# Patient Record
Sex: Female | Born: 1965 | Race: White | Hispanic: Yes | Marital: Married
Health system: Southern US, Community
[De-identification: ages and names within clinical notes are randomized; demographics above are authoritative.]

---

## 2003-05-25 ENCOUNTER — Encounter: Admission: RE | Admit: 2003-05-25 | Discharge: 2003-05-25 | Payer: Self-pay | Admitting: *Deleted

## 2003-06-01 ENCOUNTER — Encounter: Admission: RE | Admit: 2003-06-01 | Discharge: 2003-06-01 | Payer: Self-pay | Admitting: *Deleted

## 2003-06-03 ENCOUNTER — Ambulatory Visit (HOSPITAL_COMMUNITY): Admission: RE | Admit: 2003-06-03 | Discharge: 2003-06-03 | Payer: Self-pay | Admitting: Obstetrics and Gynecology

## 2003-06-08 ENCOUNTER — Encounter: Admission: RE | Admit: 2003-06-08 | Discharge: 2003-06-08 | Payer: Self-pay | Admitting: *Deleted

## 2003-06-29 ENCOUNTER — Encounter: Admission: RE | Admit: 2003-06-29 | Discharge: 2003-06-29 | Payer: Self-pay | Admitting: Obstetrics and Gynecology

## 2003-07-06 ENCOUNTER — Encounter: Admission: RE | Admit: 2003-07-06 | Discharge: 2003-07-06 | Payer: Self-pay | Admitting: *Deleted

## 2003-07-20 ENCOUNTER — Encounter: Admission: RE | Admit: 2003-07-20 | Discharge: 2003-07-20 | Payer: Self-pay | Admitting: *Deleted

## 2003-07-27 ENCOUNTER — Ambulatory Visit (HOSPITAL_COMMUNITY): Admission: RE | Admit: 2003-07-27 | Discharge: 2003-07-27 | Payer: Self-pay | Admitting: Obstetrics and Gynecology

## 2003-08-03 ENCOUNTER — Encounter: Admission: RE | Admit: 2003-08-03 | Discharge: 2003-08-03 | Payer: Self-pay | Admitting: *Deleted

## 2003-08-07 ENCOUNTER — Inpatient Hospital Stay (HOSPITAL_COMMUNITY): Admission: AD | Admit: 2003-08-07 | Discharge: 2003-08-07 | Payer: Self-pay | Admitting: Family Medicine

## 2003-08-11 ENCOUNTER — Encounter: Admission: RE | Admit: 2003-08-11 | Discharge: 2003-08-11 | Payer: Self-pay | Admitting: Family Medicine

## 2003-08-18 ENCOUNTER — Encounter: Admission: RE | Admit: 2003-08-18 | Discharge: 2003-08-18 | Payer: Self-pay | Admitting: Family Medicine

## 2003-08-25 ENCOUNTER — Encounter: Admission: RE | Admit: 2003-08-25 | Discharge: 2003-08-25 | Payer: Self-pay | Admitting: Family Medicine

## 2003-09-08 ENCOUNTER — Encounter: Admission: RE | Admit: 2003-09-08 | Discharge: 2003-09-08 | Payer: Self-pay | Admitting: Family Medicine

## 2003-09-09 ENCOUNTER — Ambulatory Visit (HOSPITAL_COMMUNITY): Admission: RE | Admit: 2003-09-09 | Discharge: 2003-09-09 | Payer: Self-pay | Admitting: Family Medicine

## 2003-09-15 ENCOUNTER — Encounter: Admission: RE | Admit: 2003-09-15 | Discharge: 2003-09-15 | Payer: Self-pay | Admitting: Family Medicine

## 2003-09-19 ENCOUNTER — Encounter: Admission: RE | Admit: 2003-09-19 | Discharge: 2003-09-19 | Payer: Self-pay | Admitting: *Deleted

## 2003-09-22 ENCOUNTER — Ambulatory Visit: Payer: Self-pay | Admitting: Family Medicine

## 2003-09-27 ENCOUNTER — Ambulatory Visit: Payer: Self-pay | Admitting: *Deleted

## 2003-09-29 ENCOUNTER — Ambulatory Visit: Payer: Self-pay | Admitting: Family Medicine

## 2003-09-30 ENCOUNTER — Ambulatory Visit: Payer: Self-pay | Admitting: Obstetrics and Gynecology

## 2003-10-03 ENCOUNTER — Ambulatory Visit: Payer: Self-pay | Admitting: Family Medicine

## 2003-10-06 ENCOUNTER — Ambulatory Visit: Payer: Self-pay | Admitting: Family Medicine

## 2003-10-09 ENCOUNTER — Inpatient Hospital Stay (HOSPITAL_COMMUNITY): Admission: AD | Admit: 2003-10-09 | Discharge: 2003-10-11 | Payer: Self-pay | Admitting: Obstetrics and Gynecology

## 2003-10-09 ENCOUNTER — Ambulatory Visit: Payer: Self-pay | Admitting: Family Medicine

## 2006-03-18 IMAGING — US US OB DETAIL+14 WK
1 series · 13 of 28 positions shown · non-contrast
Comparison: none

CLINICAL DATA: 37-year-old, G3 P2 with LMP of 01/11/03.  Patient has history of first delivery with Down syndrome, cardiac abnormality.  History of preterm delivery with a second pregnancy.

[Series 1: unknown · 0.30mm/px · 13 of 106 slices shown]
[im 4/106]
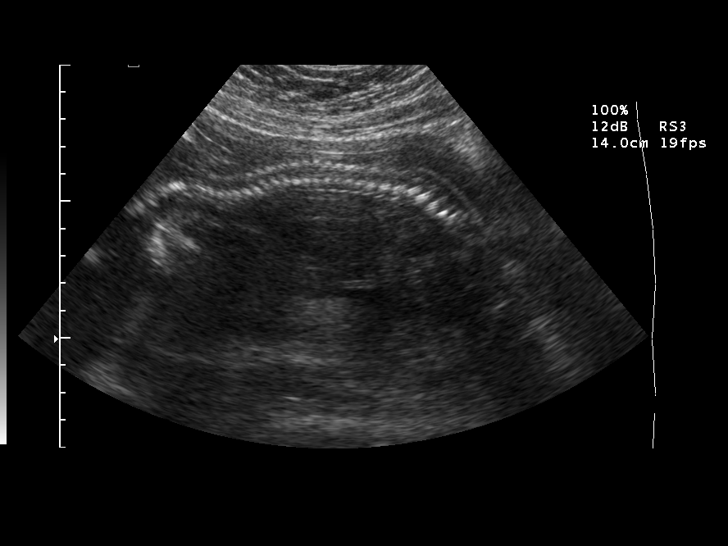
[im 12/106]
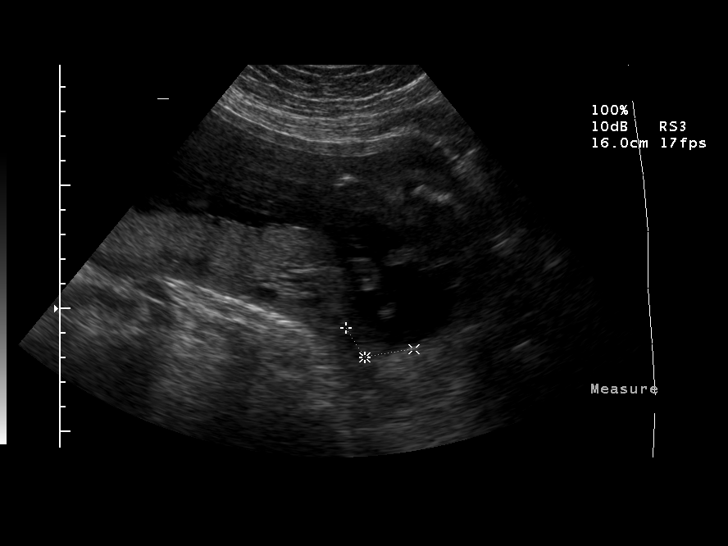
[im 20/106]
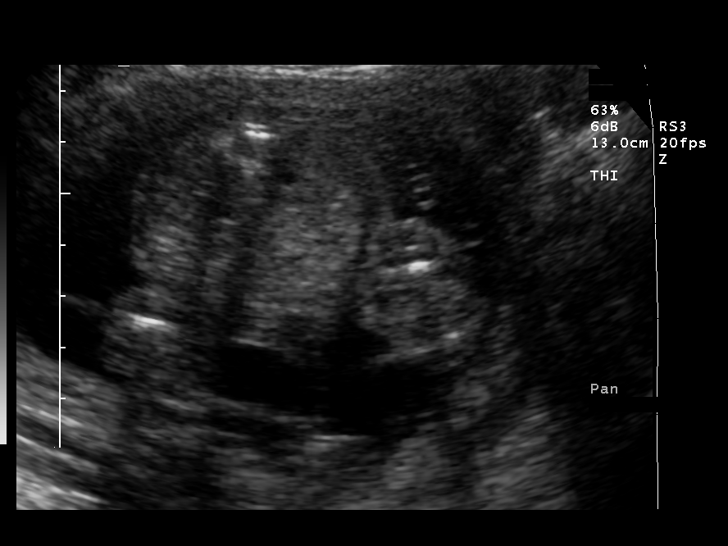
[im 28/106]
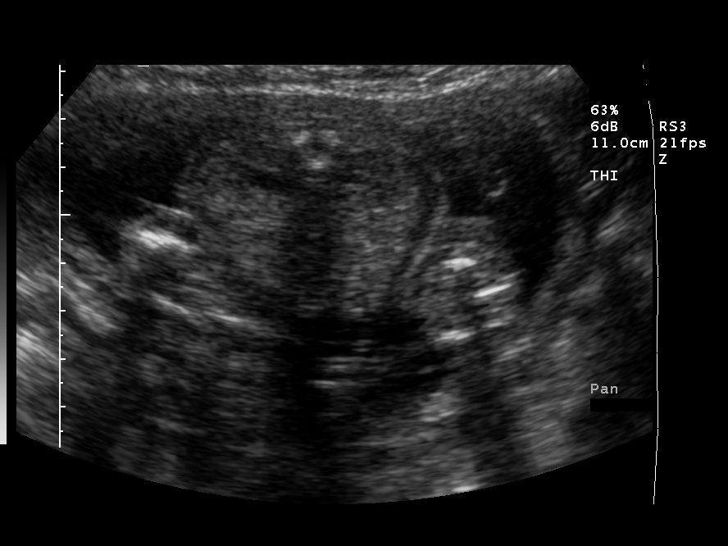
[im 36/106]
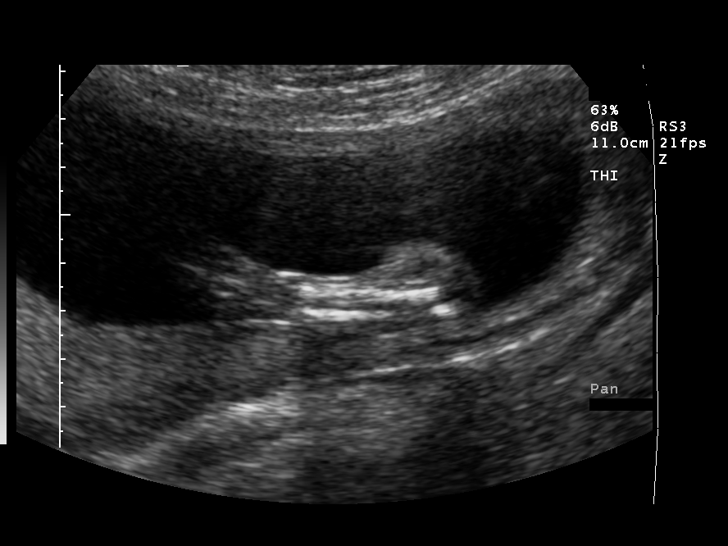
[im 43/106]
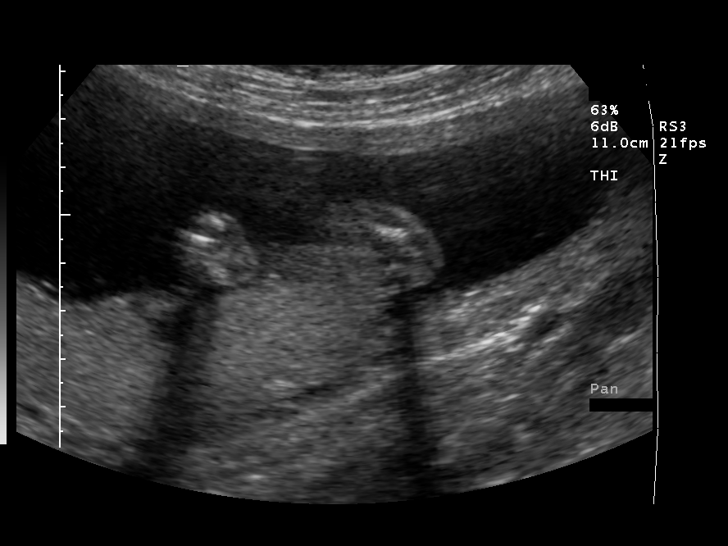
[im 55/106]
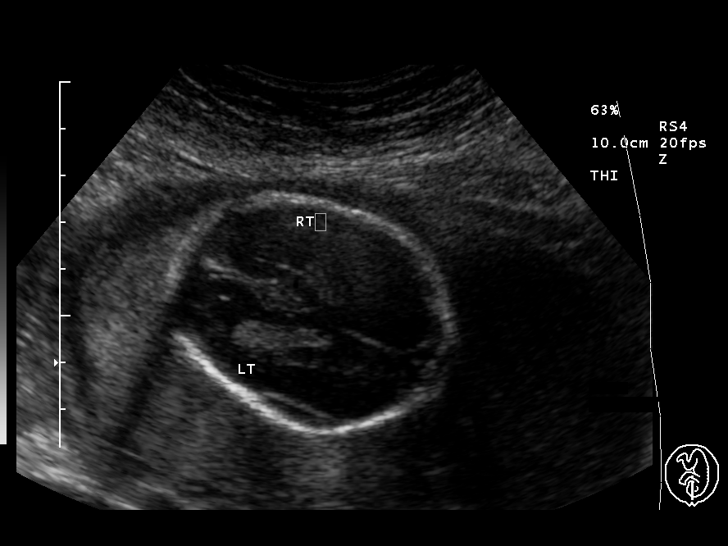
[im 63/106]
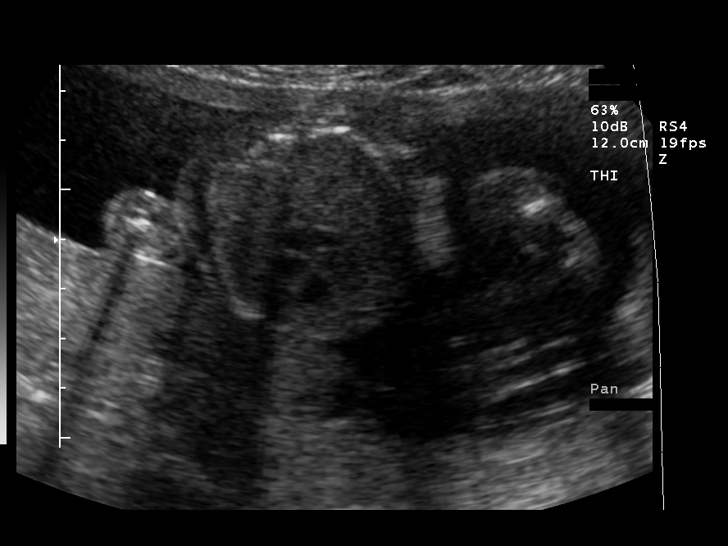
[im 71/106]
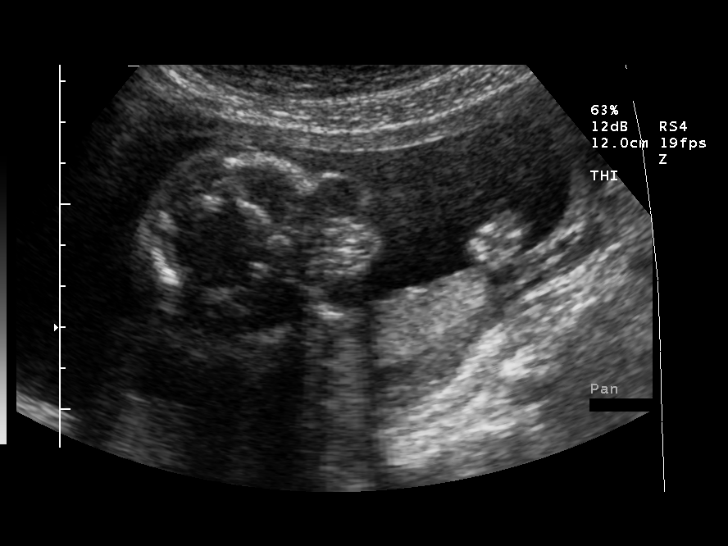
[im 78/106]
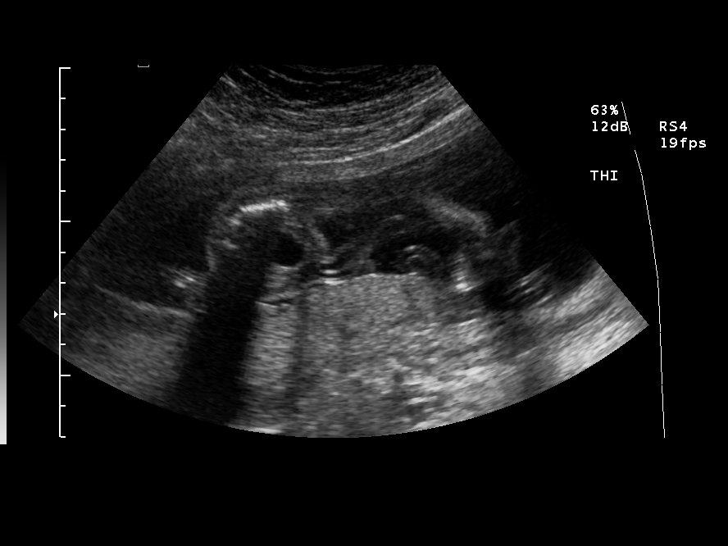
[im 86/106]
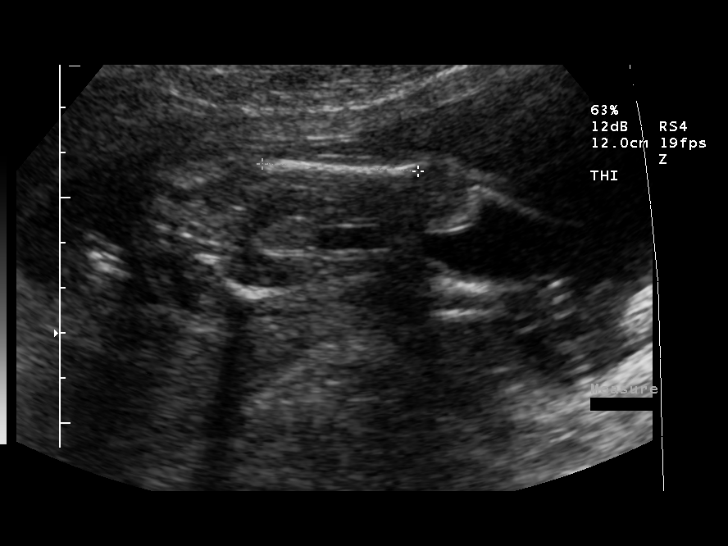
[im 94/106]
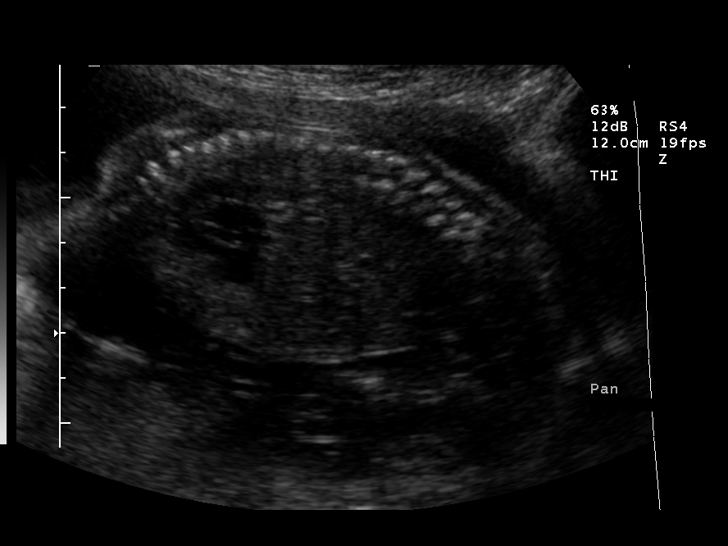
[im 102/106]
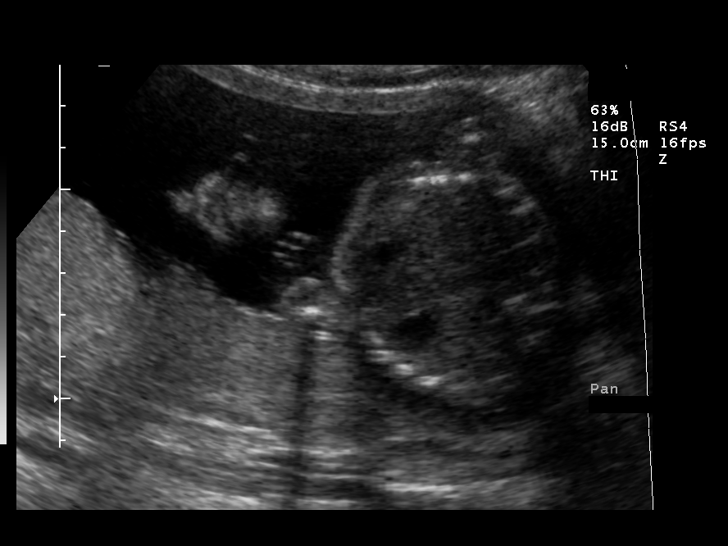

[13 of 28 positions shown; findings below may reference images not displayed]

DETAILED OBSTETRICAL ULTRASOUND 

 Number of Fetuses: 1
 Heart Rate:  142 BPM
 Movement:  Yes
 Breathing:  No
 Presentation:  Breech
 Placental Location:  Posterior
 Grade:  I
 Previa:  No
 Amniotic Fluid (Subjective):  Normal         Amniotic Fluid (Objective): 5.1 cm vertical pocket

 FETAL BIOMETRY
 BPD:   4.9 cm, 20 W 5 D
 HC:  18.2 cm, 20 W 4 D  
 AC:  15.4 cm, 20 W 4 D
 FL:  3.5 cm, 20 W 6 D
 HL:  3.2 cm, 20 W 6 D

 MEAN GA:  20 W 5 D

 FETAL ANATOMY
 Lateral Ventricles:    visualized 
 Thalami/CSP:  visualized   * note is made of a right choroid plexus cyst measuring 8 mm
 Posterior Fossa:  visualized 
 Nuchal Region:  visualized 
 Spine:  visualized 
 4 Chamber Heart on Left:  visualized 
 Stomach on Left:  visualized 
 3 Vessel Cord:  visualized 
 Cord Insertion Site:  visualized 
 Kidneys:  visualized 
 Bladder:  visualized 
 Extremities:  visualized 

 ADDITIONAL ANATOMY VISUALIZED:  LVOT, RVOT, upper lip, orbits, profile, diaphragm, heel, 5th digit, ductal arch, and aortic arch.

 MATERNAL FINDINGS
 Cervix:  3.3 cm transabdominally.
IMPRESSION: Single living intrauterine fetus in breech presentation.  Size and dates correlate well.  Note is made of a choroid plexus cyst on the right.  This finding has been associated with aneuploidy, specifically trisomy 18, but is seen in up to 2 percent of normal fetuses.  As an isolated finding, it is unlikely to be clinically significant.  Correlation with maternal serum triple screen results for risk of trisomy 18 is recommended, as well as follow-up ultrasound to evaluate fetal growth.  The findings were discussed with the patient through an interpreter.  

 </u12:p>

## 2006-05-11 IMAGING — US US OB FOLLOW-UP
1 series · 18 of 28 positions shown · non-contrast
Comparison: none

CLINICAL DATA: 28 week 1 day assigned gestational age.  Gestational diabetes.  Evaluate fetal growth and estimated fetal weight.

[Series 1: ob detail > 14 wks · 18 of 33 slices shown]
[im 1/33]
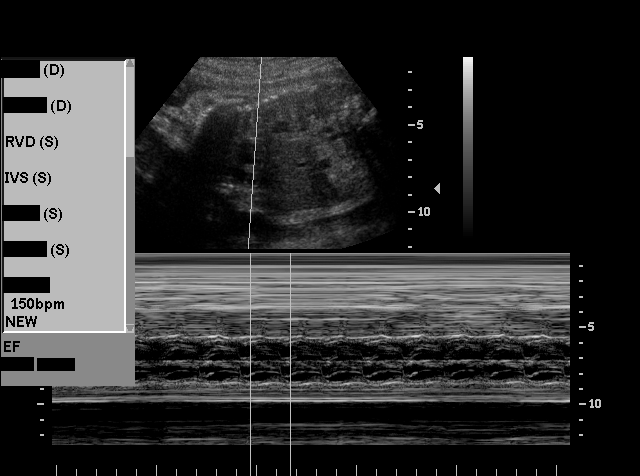
[im 3/33]
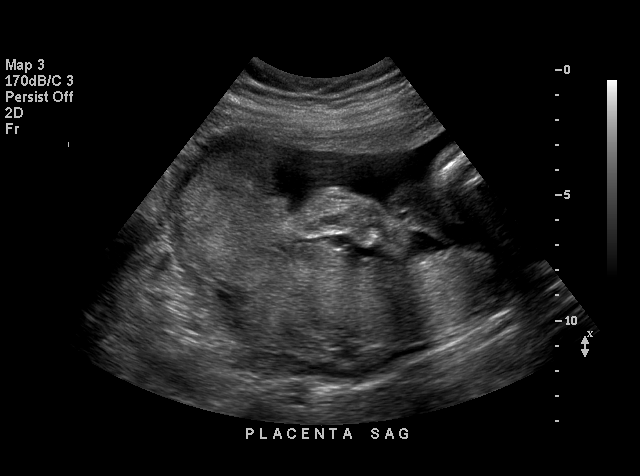
[im 4/33]
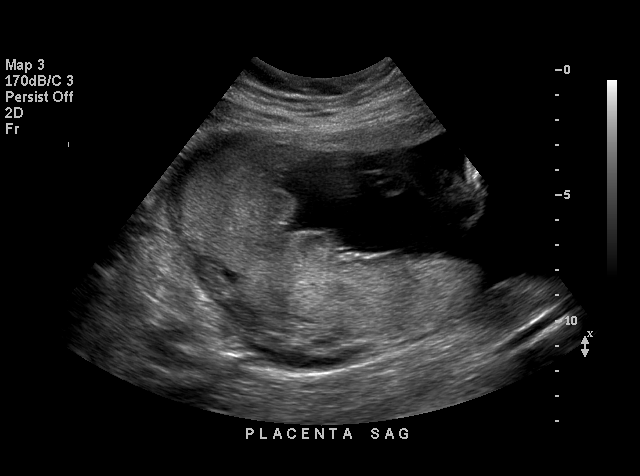
[im 6/33]
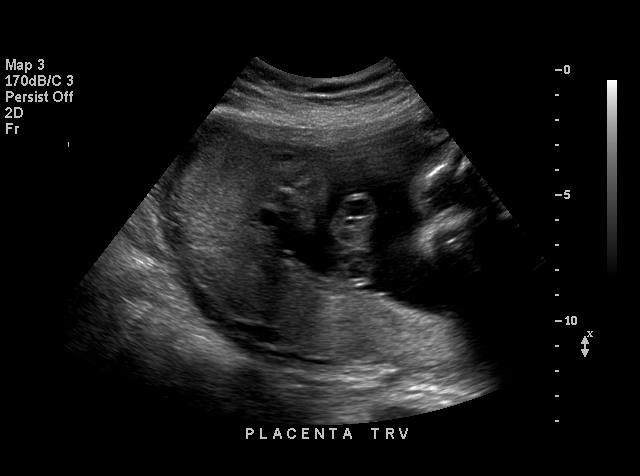
[im 9/33]
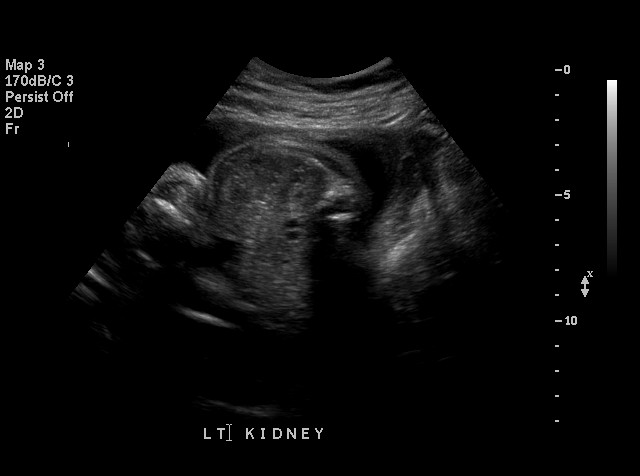
[im 10/33]
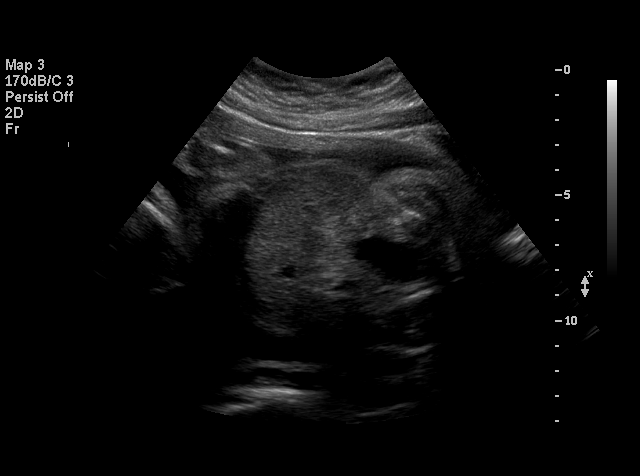
[im 12/33]
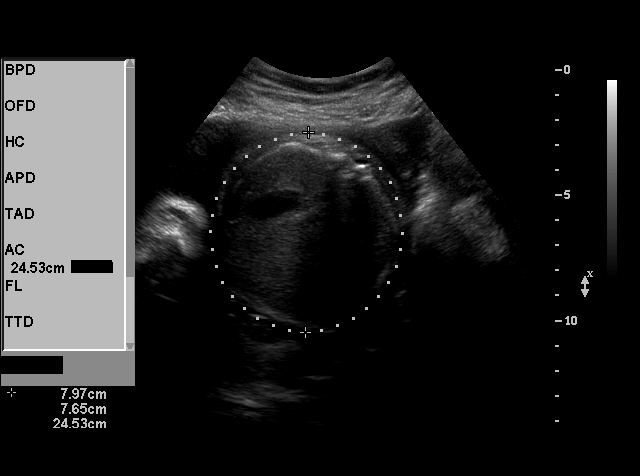
[im 14/33]
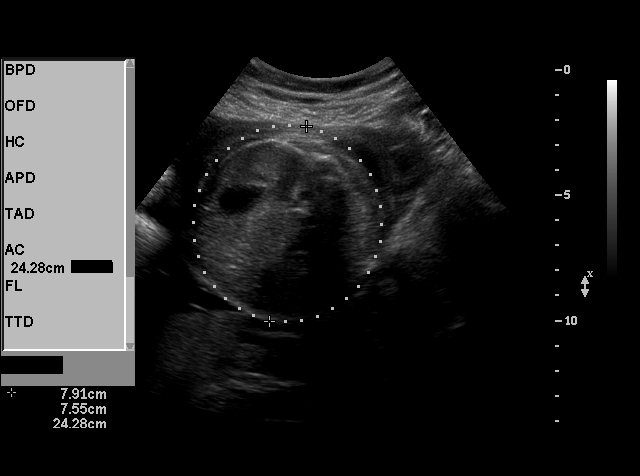
[im 16/33]
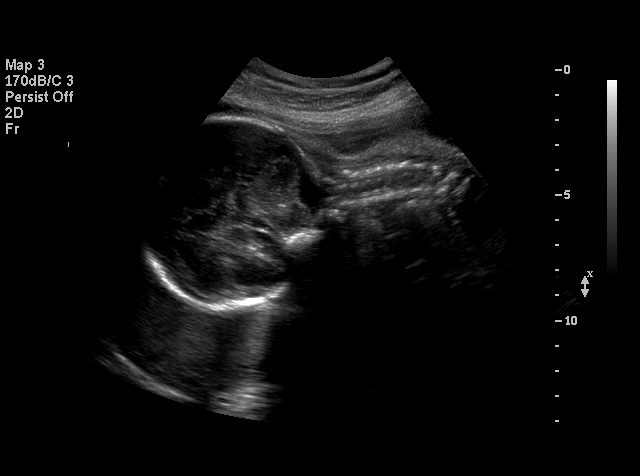
[im 17/33]
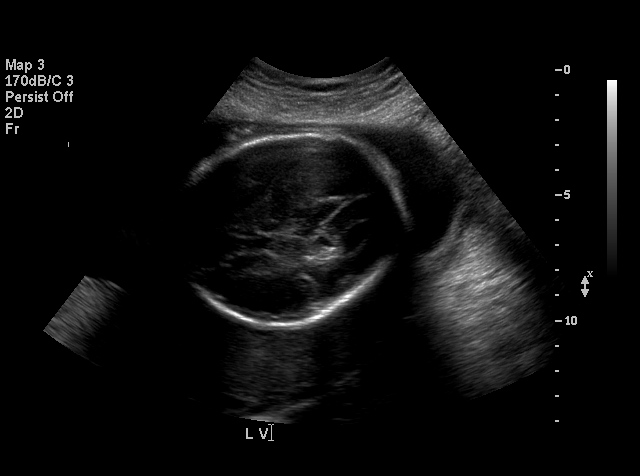
[im 19/33]
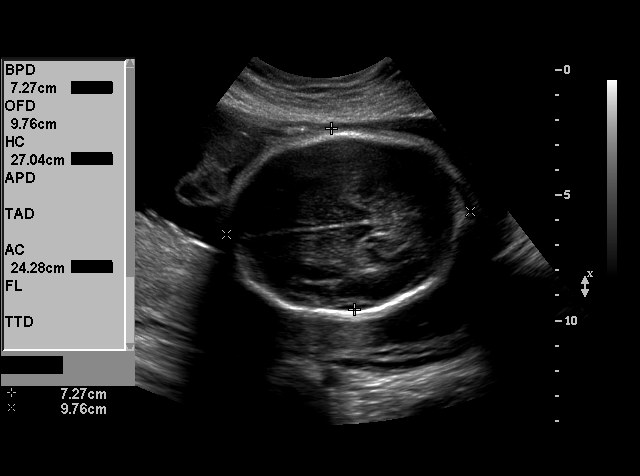
[im 21/33]
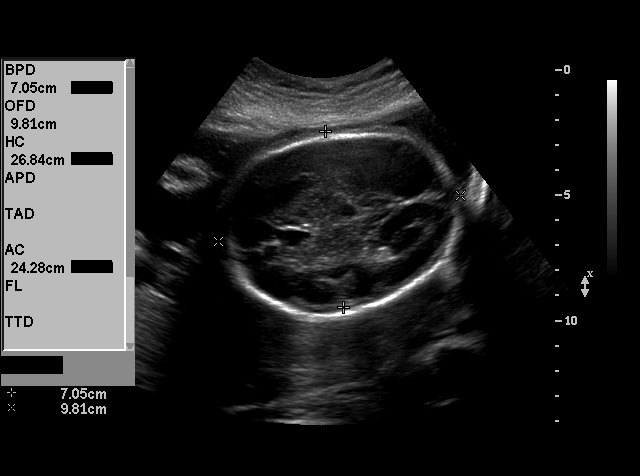
[im 23/33]
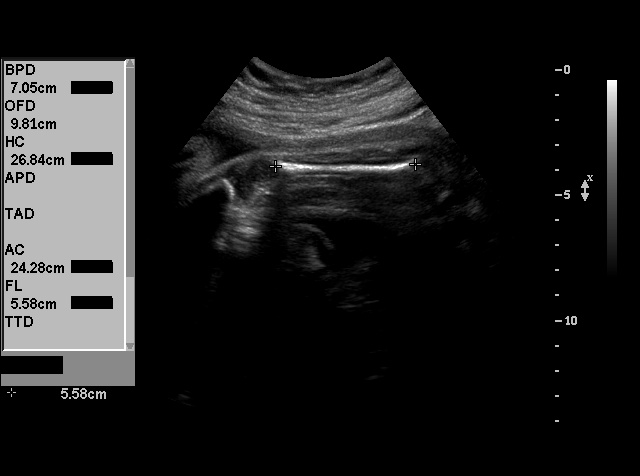
[im 25/33]
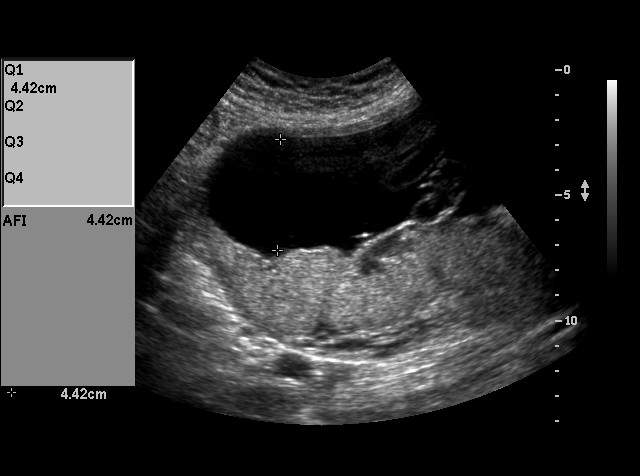
[im 27/33]
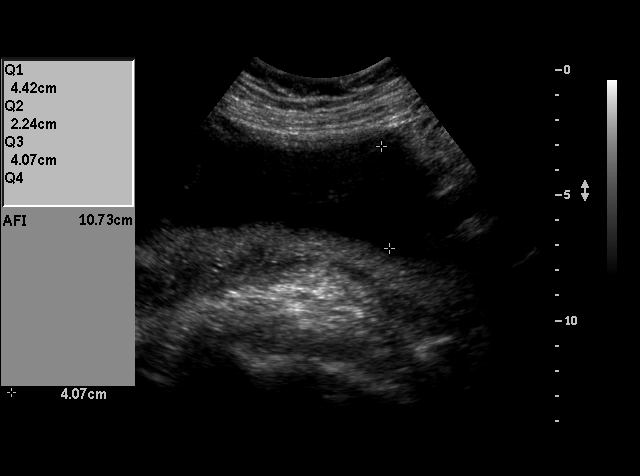
[im 29/33]
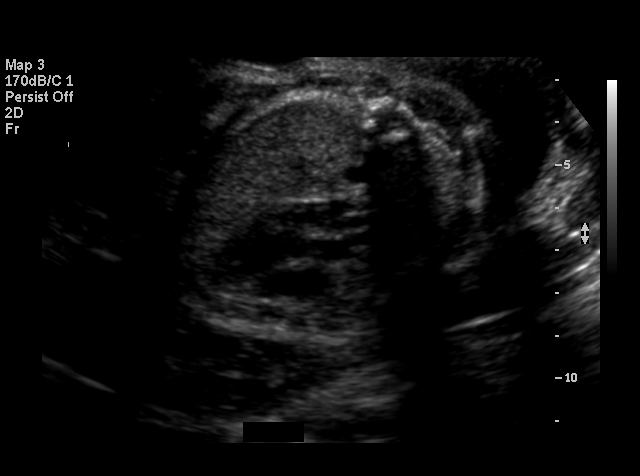
[im 30/33]
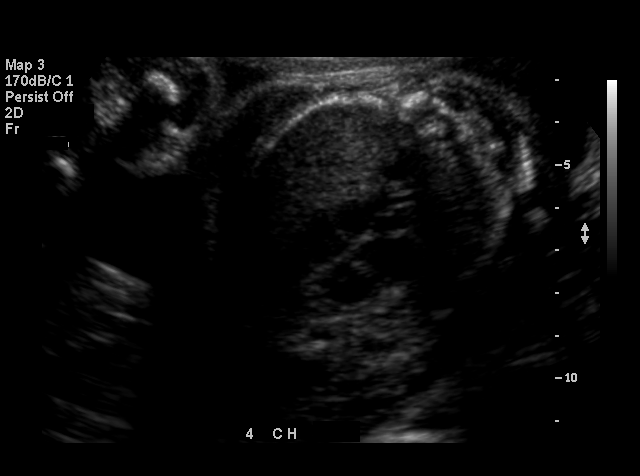
[im 33/33]
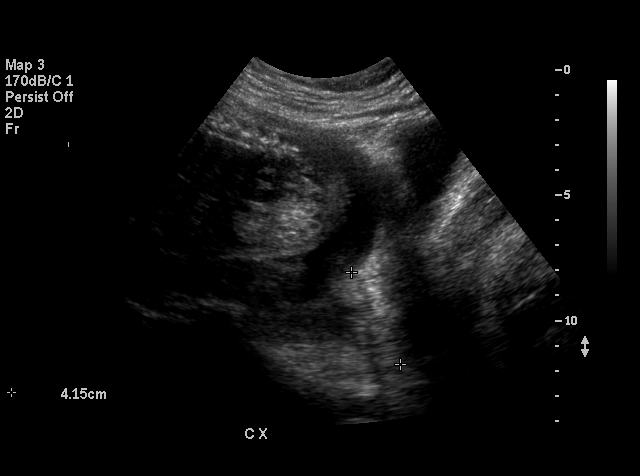

[18 of 28 positions shown; findings below may reference images not displayed]

OBSTETRICAL ULTRASOUND RE-EVALUATION
 Number of Fetuses:  1
 Heart Rate:  150
 Movement:  Yes
 Breathing:  No
 Presentation:  Breech
 Placental Location:  Posterior
 Grade:  I
 Previa:  No
 Amniotic Fluid (subjective):  Normal
 Amniotic Fluid (objective):  13.5 cm AFI (5th -95th%ile =   9.4 – 22.8 cm for 28 wks)

 FETAL BIOMETRY
 BPD:  7.1 cm   28 w 5 d
 HC:  26.8 cm   29 w 2 d
 AC:  24.4 cm   28 w 5 d
 FL:   5.6 cm   29 w 6 d

 Mean GA:  29 w 1 d
 Assigned GA:  28 w 1 d

 EFW:  5588 g (S) 75th – 90th%ile (4495 – 4366 g) for 28 wks

 FETAL ANATOMY
 Lateral Ventricles:  Visualized   
 Thalami/CSP:  Visualized 
 Posterior Fossa:  Visualized 
 Nuchal Region:  Previously seen 
 Spine:  Previously seen 
 4 Chamber Heart on Left:  Previously seen 
 Stomach on Left:  Visualized 
 3 Vessel Cord:  Visualized 
 Cord Insertion Site:  Previously seen   
 Kidneys:  Visualized 
 Bladder:  Visualized 
 Extremities:  Previously seen 

 MATERNAL FINDINGS
 Cervix:  4.1 cm Transabdominally
IMPRESSION: Assigned gestational age is currently 28 weeks 1 day.  Appropriate fetal growth with EFW now currently between 75th and 90th percentiles.  Consider further ultrasound follow-up of fetal growth if clinically warranted.
 Normal amniotic fluid volume.

## 2006-06-24 IMAGING — US US OB FOLLOW-UP
1 series · 13 of 28 positions shown · non-contrast
Comparison: none

CLINICAL DATA: Assess growth.  G3 P2.  LMP 01/11/03 ([DATE] weeks 3 days).

[Series 1: us ob follow-up · 0.33mm/px · 13 of 32 slices shown]
[im 2/32]
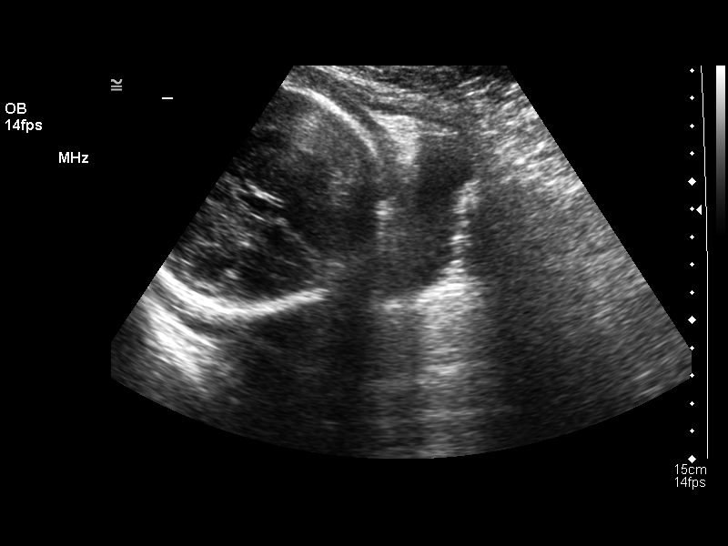
[im 4/32]
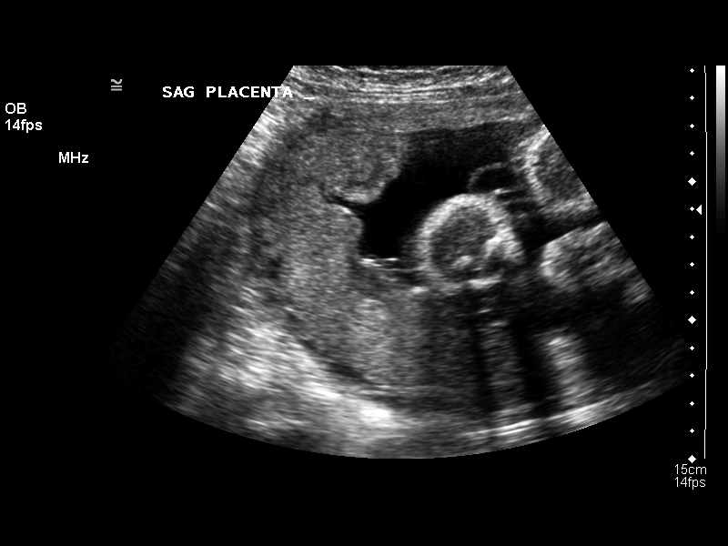
[im 6/32]
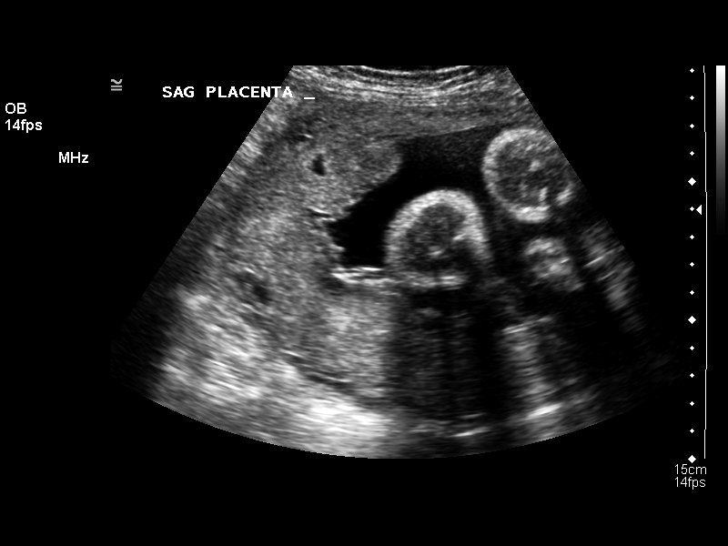
[im 9/32]
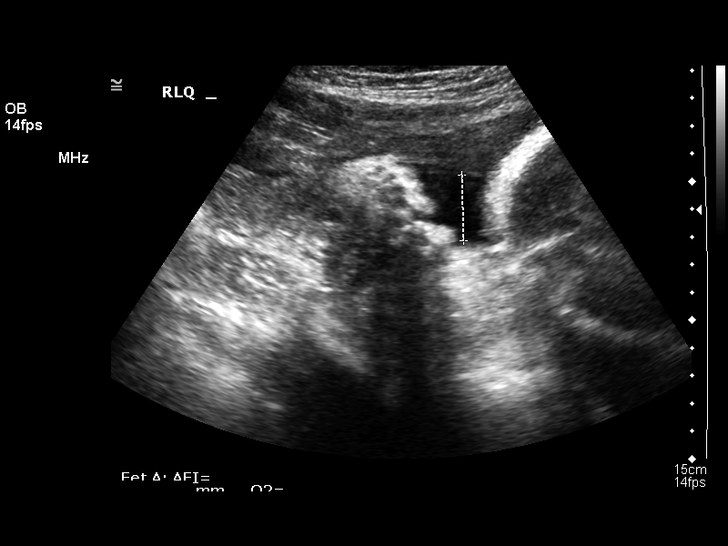
[im 11/32]
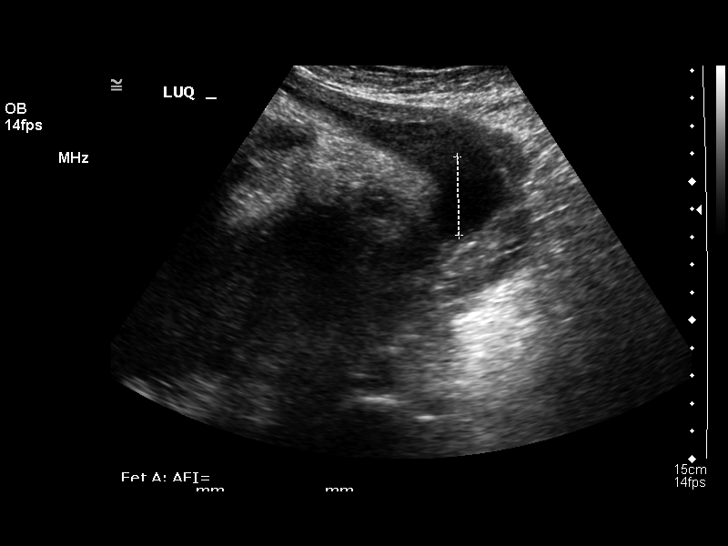
[im 13/32]
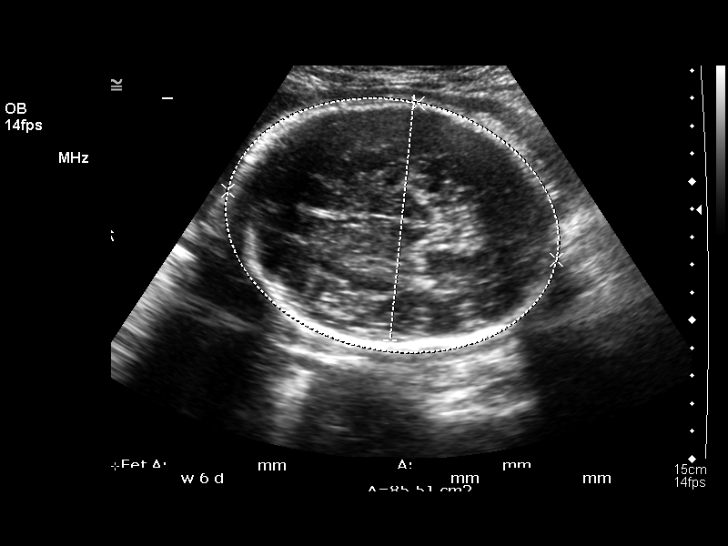
[im 17/32]
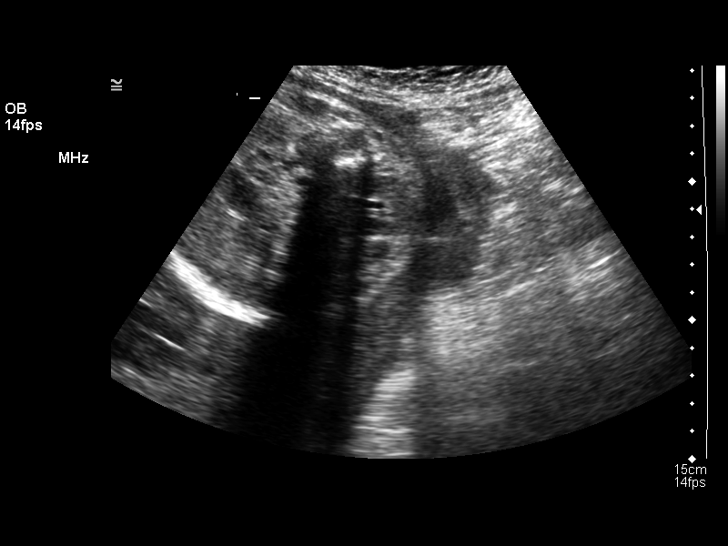
[im 19/32]
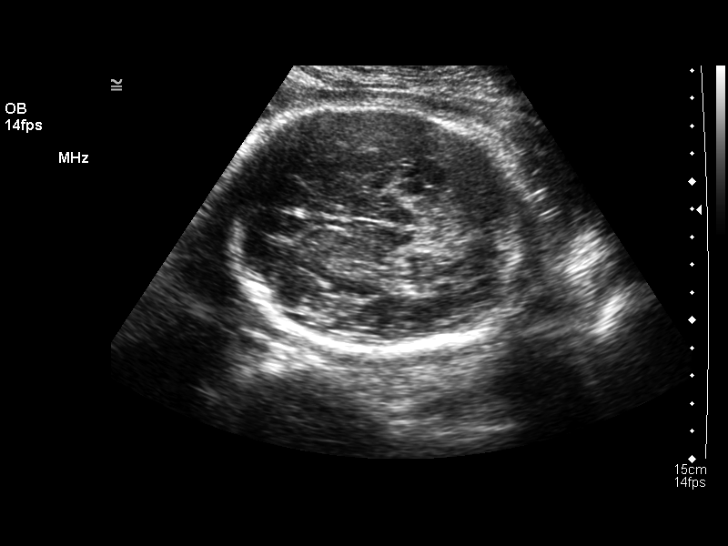
[im 21/32]
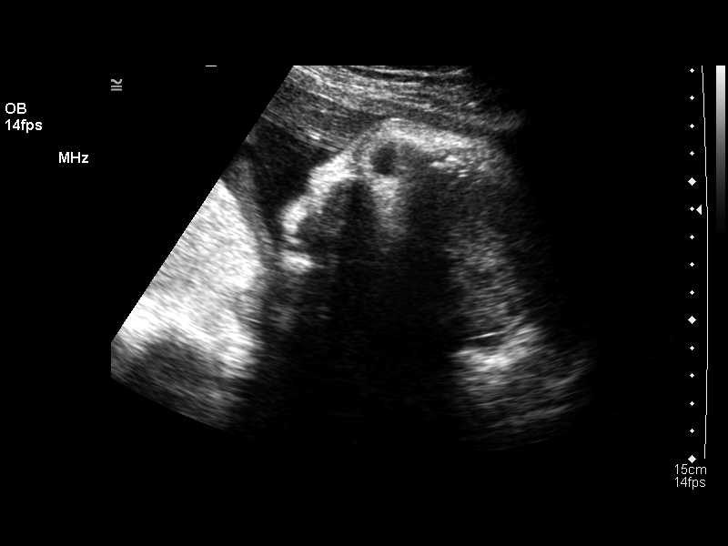
[im 23/32]
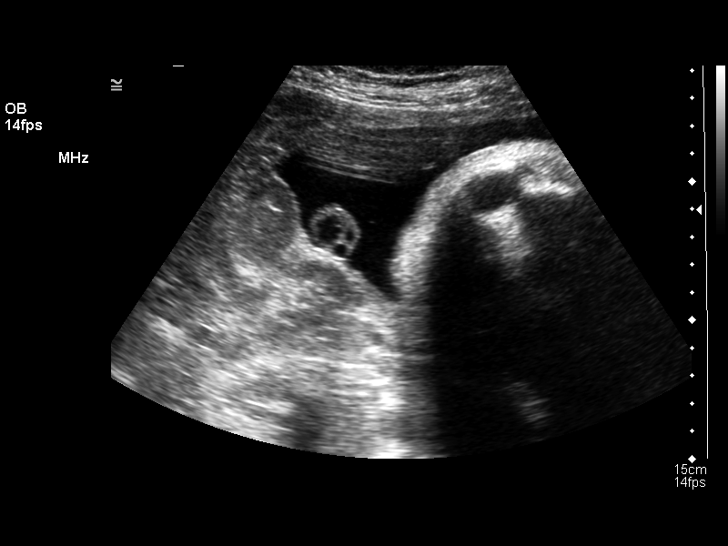
[im 26/32]
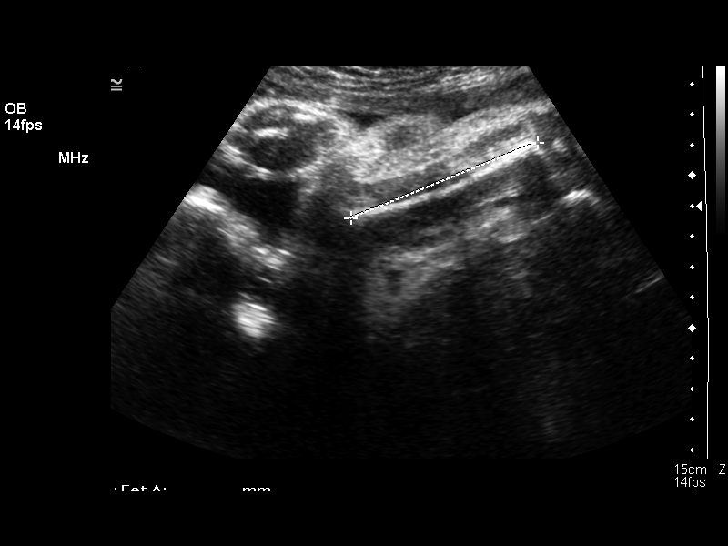
[im 28/32]
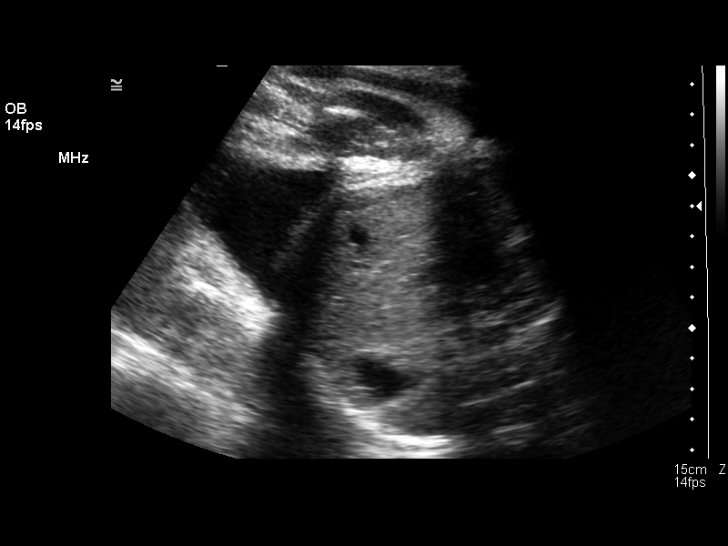
[im 30/32]
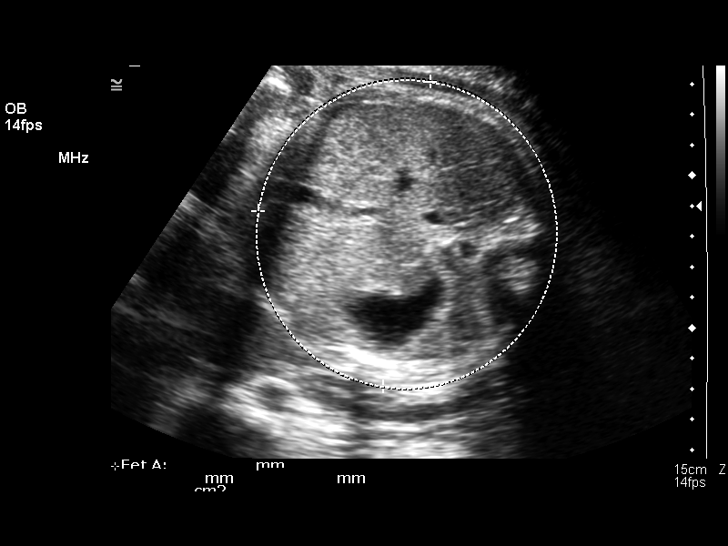

[13 of 28 positions shown; findings below may reference images not displayed]

OBSTETRICAL ULTRASOUND RE-EVALUATION
 Number of Fetuses:  1
 Heart Rate:  171
 Movement:  Yes
 Breathing:  No
 Presentation:  Cephalic 
 Placental Location:  Posterior, right lateral 
 Grade:  II
 Previa:  No
 Amniotic Fluid (subjective):  Normal
 Amniotic Fluid (objective):  12.6 cm AFI (5th -95th%ile = 8.1 – 24.8 cm for 34 wks)

 FETAL BIOMETRY
 BPD:    8.7 cm   35 w 0 d
 HC:  33.0 cm  38 w 0 d
 AC:  31.9 cm   35 w 6 d
 FL:   6.7 cm   34 w 3 d 

 Mean GA:  36 w 0 d
 Assigned GA:  34 w 3 d

 EFW:  0556 g (S) 75th - 90th%ile (6150 – 4707 g) for 34 wks

 FETAL ANATOMY
 Lateral Ventricles:  Visualized 
 Thalami/CSP:  Visualized 
 Posterior Fossa:  Previously seen 
 Nuchal Region:  Previously seen 
 Spine:  Previously seen 
 4 Chamber Heart on Left:  Visualized 
 Stomach on Left:  Visualized 
 3 Vessel Cord:  Visualized 
 Cord Insertion Site:  Previously seen 
 Kidneys:  Visualized 
 Bladder:  Visualized 
 Extremities:  Previously seen 

 ADDITIONAL ANATOMY VISUALIZED:  Upper lip, orbits, diaphragm, and male genitalia

 MATERNAL FINDINGS
 Cervix:  3.7 cm Transabdominally
IMPRESSION: Single living intrauterine fetus in cephalic presentation.  Patient is 34 weeks 3 days by LMP dating and measures 36 weeks today with a fetal weight of 0556 grams, falling between the 75th and 90th percentile for 34 weeks.  Growth is appropriate.
 Normal amniotic fluid volume with AFI 12.6 cm.

## 2007-10-14 ENCOUNTER — Encounter (INDEPENDENT_AMBULATORY_CARE_PROVIDER_SITE_OTHER): Payer: Self-pay | Admitting: Family Medicine

## 2007-10-14 ENCOUNTER — Ambulatory Visit: Payer: Self-pay | Admitting: Gynecology

## 2007-10-14 ENCOUNTER — Other Ambulatory Visit: Admission: RE | Admit: 2007-10-14 | Discharge: 2007-10-14 | Payer: Self-pay | Admitting: Gynecology

## 2007-10-28 ENCOUNTER — Ambulatory Visit: Payer: Self-pay | Admitting: Obstetrics & Gynecology

## 2010-02-11 ENCOUNTER — Encounter: Payer: Self-pay | Admitting: *Deleted

## 2010-06-05 NOTE — Group Therapy Note (Signed)
Catherine Webb, Catherine Webb       ACCOUNT NO.:  [id number]   MEDICAL RECORD NO.:  [id number]          PATIENT TYPE:  WOC   LOCATION:  [facility]                   FACILITY:  [facility]   PHYSICIAN:  Ginger Carne, MD DATE OF BIRTH:  1965-06-23   DATE OF SERVICE:  10/14/2007                                  CLINIC NOTE   REASON FOR VISIT:  Ms. Ashantae Pangallo is a 45 year old gravida 3,  para 3 who had a Pap smear done approximately 1 month ago that was  normal; however, during the exam, the practitioner noted an abnormal  cervical lesion.  She was sent here for further evaluation.  Additionally, the patient relates 1-day history of a moderate-to-severe  sore throat with temperatures over 100 at home, no cough, no runny nose.   PAST MEDICAL HISTORY:  Significant for diabetes or impaired glucose  tolerance.   PAST SURGICAL HISTORY:  None.   PAST OB HISTORY:  She has had 3 children, all of them delivered  vaginally.   ALLERGIES:  No known drug allergies.   The first day of her last menstrual period is September 27, 2007.   PHYSICAL EXAMINATION:  In the clinic today she had blood pressure  117/79, heart rate of 83, temperature 99.8, weight is 143.8, and her  height is 61 inches.  She had a urine pregnancy test done today which is negative.  On examination of throat her tonsils are swollen, significantly  erythematous and injected, and exudate is noted on the right.  An evaluation of her neck reveals bilateral anterior cervical  lymphadenopathy that is tender on the right side.  GU:  Her external female genitalia is normal.  Her vagina is normal  without discharge.  There are no apparent vaginal lesions.  Cervix is a  parous cervix.  The transformation zone is visualized 360 degrees.  The  cervix is noted to be moderately glandular.  There is no abnormal  vasculature.  No punctation or mosaic pattern.  Acetowhite stain reveals  no significant areas with acetowhite stain.   There is an irregular  papular lesion at the 12 o'clock position on the cervix that is  approximately 3 to 4 mm large.  Because of the abnormal appearance of  this lesion, a biopsy was performed.  Moderate bleeding was noted, but  hemostasis was obtained with Monsel's solution.  The lesion will be sent  for pathology, and the patient will follow up in 2 weeks for the  results.   INITIAL IMPRESSION:  This is either a benign cervical polyp or possibly  a condyloma.  Additionally, the patient likely has strep pharyngitis and  was given a prescription for penicillin VK for 500 mg 3 times a day for  10-day  course.  The patient will follow up in 2 weeks for her results.  All  patient's questions were answered, and patient understood the reason for  the procedure as well as the appropriate post procedure instructions and  followup.     ______________________________  Odie Sera, D.O.    ______________________________  Ginger Carne, MD    MC/MEDQ  D:  10/14/2007  T:  10/14/2007  Job:  [id number] 

## 2010-10-22 LAB — POCT PREGNANCY, URINE: Preg Test, Ur: NEGATIVE

## 2019-05-01 ENCOUNTER — Ambulatory Visit: Payer: Self-pay | Attending: Internal Medicine

## 2019-05-01 DIAGNOSIS — Z23 Encounter for immunization: Secondary | ICD-10-CM

## 2019-05-01 NOTE — Progress Notes (Signed)
   Covid-19 Vaccination Clinic  Name:  Catherine Webb    MRN: [id number] DOB: 06-28-1965  05/01/2019  Catherine Webb was observed post Covid-19 immunization for 15 minutes without incident. She was provided with Vaccine Information Sheet and instruction to access the V-Safe system.   Catherine Webb was instructed to call 911 with any severe reactions post vaccine: Marland Kitchen Difficulty breathing  . Swelling of face and throat  . A fast heartbeat  . A bad rash all over body  . Dizziness and weakness   Immunizations Administered    Name Date Dose VIS Date Route   Pfizer COVID-19 Vaccine 05/01/2019  9:58 AM 0.3 mL 01/01/2019 Intramuscular   Manufacturer: [employer], [employer]   Lot: [id number]   NDC: [address]-5

## 2019-05-26 ENCOUNTER — Ambulatory Visit: Payer: Self-pay

## 2019-06-01 ENCOUNTER — Ambulatory Visit: Payer: Self-pay | Attending: Internal Medicine
